# Patient Record
Sex: Female | Born: 1975 | Race: White | Hispanic: No | Smoking: Current every day smoker
Health system: Southern US, Community
[De-identification: ages and names within clinical notes are randomized; demographics above are authoritative.]

## PROBLEM LIST (undated history)

## (undated) DIAGNOSIS — Z974 Presence of external hearing-aid: Secondary | ICD-10-CM

## (undated) DIAGNOSIS — G43909 Migraine, unspecified, not intractable, without status migrainosus: Secondary | ICD-10-CM

## (undated) DIAGNOSIS — G9050 Complex regional pain syndrome I, unspecified: Secondary | ICD-10-CM

## (undated) DIAGNOSIS — R42 Dizziness and giddiness: Secondary | ICD-10-CM

## (undated) DIAGNOSIS — M1990 Unspecified osteoarthritis, unspecified site: Secondary | ICD-10-CM

## (undated) DIAGNOSIS — K219 Gastro-esophageal reflux disease without esophagitis: Secondary | ICD-10-CM

## (undated) DIAGNOSIS — M797 Fibromyalgia: Secondary | ICD-10-CM

## (undated) HISTORY — PX: MOUTH SURGERY: SHX715

## (undated) HISTORY — PX: ABDOMINAL HYSTERECTOMY: SHX81

## (undated) HISTORY — PX: CARPAL TUNNEL RELEASE: SHX101

---

## 2017-05-02 ENCOUNTER — Other Ambulatory Visit: Payer: Self-pay | Admitting: Student

## 2017-05-02 DIAGNOSIS — M75101 Unspecified rotator cuff tear or rupture of right shoulder, not specified as traumatic: Secondary | ICD-10-CM

## 2017-05-10 ENCOUNTER — Ambulatory Visit
Admission: RE | Admit: 2017-05-10 | Discharge: 2017-05-10 | Disposition: A | Discharge status: Home / Self Care | Payer: Medicaid Other | Source: Ambulatory Visit | Attending: Student | Admitting: Student

## 2017-05-10 DIAGNOSIS — M7551 Bursitis of right shoulder: Secondary | ICD-10-CM | POA: Insufficient documentation

## 2017-05-10 DIAGNOSIS — M24211 Disorder of ligament, right shoulder: Secondary | ICD-10-CM | POA: Insufficient documentation

## 2017-05-10 DIAGNOSIS — M25511 Pain in right shoulder: Secondary | ICD-10-CM | POA: Insufficient documentation

## 2017-05-10 DIAGNOSIS — M75101 Unspecified rotator cuff tear or rupture of right shoulder, not specified as traumatic: Secondary | ICD-10-CM

## 2017-05-15 ENCOUNTER — Other Ambulatory Visit: Payer: Self-pay

## 2017-05-15 ENCOUNTER — Encounter: Payer: Self-pay | Admitting: *Deleted

## 2017-05-17 ENCOUNTER — Encounter
Admission: RE | Disposition: A | Discharge status: Home / Self Care | Payer: Self-pay | Source: Ambulatory Visit | Attending: Surgery

## 2017-05-17 ENCOUNTER — Ambulatory Visit: Payer: Medicaid Other | Admitting: Anesthesiology

## 2017-05-17 ENCOUNTER — Ambulatory Visit
Admission: RE | Admit: 2017-05-17 | Discharge: 2017-05-17 | Disposition: A | Discharge status: Home / Self Care | Payer: Medicaid Other | Source: Ambulatory Visit | Attending: Surgery | Admitting: Surgery

## 2017-05-17 DIAGNOSIS — K219 Gastro-esophageal reflux disease without esophagitis: Secondary | ICD-10-CM | POA: Diagnosis not present

## 2017-05-17 DIAGNOSIS — M7501 Adhesive capsulitis of right shoulder: Secondary | ICD-10-CM | POA: Diagnosis not present

## 2017-05-17 DIAGNOSIS — Z79899 Other long term (current) drug therapy: Secondary | ICD-10-CM | POA: Insufficient documentation

## 2017-05-17 HISTORY — DX: Presence of external hearing-aid: Z97.4

## 2017-05-17 HISTORY — DX: Dizziness and giddiness: R42

## 2017-05-17 HISTORY — DX: Unspecified osteoarthritis, unspecified site: M19.90

## 2017-05-17 HISTORY — DX: Gastro-esophageal reflux disease without esophagitis: K21.9

## 2017-05-17 HISTORY — PX: CLOSED MANIPULATION SHOULDER WITH STERIOD INJECTION: SHX5611

## 2017-05-17 HISTORY — DX: Complex regional pain syndrome I, unspecified: G90.50

## 2017-05-17 HISTORY — DX: Fibromyalgia: M79.7

## 2017-05-17 HISTORY — DX: Migraine, unspecified, not intractable, without status migrainosus: G43.909

## 2017-05-17 SURGERY — CLOSED MANIPULATION SHOULDER WITH STEROID INJECTION
Anesthesia: General | Laterality: Right | Wound class: Clean Contaminated

## 2017-05-17 MED ORDER — HYDROCODONE-ACETAMINOPHEN 5-325 MG PO TABS: 1.0000 | ORAL_TABLET | ORAL | Status: DC | PRN

## 2017-05-17 MED ORDER — ONDANSETRON HCL 4 MG PO TABS: 4.0000 mg | ORAL_TABLET | Freq: Four times a day (QID) | ORAL | Status: DC | PRN

## 2017-05-17 MED ORDER — PROPOFOL 10 MG/ML IV BOLUS
INTRAVENOUS | Status: DC | PRN
  Administered 2017-05-17: 20 mg via INTRAVENOUS
  Administered 2017-05-17: 100 mg via INTRAVENOUS

## 2017-05-17 MED ORDER — MEPERIDINE HCL 25 MG/ML IJ SOLN: 6.2500 mg | INTRAMUSCULAR | Status: DC | PRN

## 2017-05-17 MED ORDER — PROMETHAZINE HCL 25 MG/ML IJ SOLN: 6.2500 mg | INTRAMUSCULAR | Status: DC | PRN

## 2017-05-17 MED ORDER — OXYCODONE HCL 5 MG/5ML PO SOLN: 5.0000 mg | Freq: Once | ORAL | Status: DC | PRN

## 2017-05-17 MED ORDER — HYDROCODONE-ACETAMINOPHEN 5-325 MG PO TABS: 1.0000 | ORAL_TABLET | Freq: Four times a day (QID) | ORAL | 0 refills | Status: AC | PRN

## 2017-05-17 MED ORDER — LIDOCAINE HCL 1 % IJ SOLN
INTRAMUSCULAR | Status: DC | PRN
  Administered 2017-05-17: 9 mL

## 2017-05-17 MED ORDER — METOCLOPRAMIDE HCL 5 MG/ML IJ SOLN: 5.0000 mg | Freq: Three times a day (TID) | INTRAMUSCULAR | Status: DC | PRN

## 2017-05-17 MED ORDER — METOCLOPRAMIDE HCL 5 MG PO TABS: 5.0000 mg | ORAL_TABLET | Freq: Three times a day (TID) | ORAL | Status: DC | PRN

## 2017-05-17 MED ORDER — FENTANYL CITRATE (PF) 100 MCG/2ML IJ SOLN
25.0000 ug | INTRAMUSCULAR | Status: DC | PRN
  Administered 2017-05-17: 100 ug via INTRAVENOUS

## 2017-05-17 MED ORDER — POTASSIUM CHLORIDE IN NACL 20-0.9 MEQ/L-% IV SOLN: INTRAVENOUS | Status: DC

## 2017-05-17 MED ORDER — DEXMEDETOMIDINE HCL 200 MCG/2ML IV SOLN
INTRAVENOUS | Status: DC | PRN
  Administered 2017-05-17: 8 ug via INTRAVENOUS

## 2017-05-17 MED ORDER — TRIAMCINOLONE ACETONIDE 40 MG/ML IJ SUSP
INTRAMUSCULAR | Status: DC | PRN
Start: 2017-05-17 — End: 2017-05-17
  Administered 2017-05-17: 40 mg via INTRA_ARTICULAR

## 2017-05-17 MED ORDER — ONDANSETRON 4 MG PO TBDP: 4.0000 mg | ORAL_TABLET | Freq: Three times a day (TID) | ORAL | 1 refills | Status: AC | PRN

## 2017-05-17 MED ORDER — ONDANSETRON HCL 4 MG/2ML IJ SOLN: 4.0000 mg | Freq: Four times a day (QID) | INTRAMUSCULAR | Status: DC | PRN

## 2017-05-17 MED ORDER — OXYCODONE HCL 5 MG PO TABS: 5.0000 mg | ORAL_TABLET | Freq: Once | ORAL | Status: DC | PRN

## 2017-05-17 MED ORDER — LACTATED RINGERS IV SOLN
10.0000 mL/h | INTRAVENOUS | Status: DC
  Administered 2017-05-17: 10 mL/h via INTRAVENOUS

## 2017-05-17 MED ORDER — MIDAZOLAM HCL 2 MG/2ML IJ SOLN
INTRAMUSCULAR | Status: DC | PRN
  Administered 2017-05-17: 2 mg via INTRAVENOUS

## 2017-05-17 SURGICAL SUPPLY — 7 items
BNDG ADH 2 X3.75 FABRIC TAN LF (GAUZE/BANDAGES/DRESSINGS) ×3 IMPLANT
KIT RM TURNOVER STRD PROC AR (KITS) ×3 IMPLANT
NEEDLE HYPO 21X1.5 SAFETY (NEEDLE) IMPLANT
PAD ALCOHOL SWAB (MISCELLANEOUS) IMPLANT
SLING ARM LRG DEEP (SOFTGOODS) ×3 IMPLANT
STRAP BODY AND KNEE 60X3 (MISCELLANEOUS) ×3 IMPLANT
SYRINGE 10CC LL (SYRINGE) IMPLANT

## 2017-05-17 NOTE — Progress Notes (Signed)
Assisted Nicole Scouras, ANMD with right, ultrasound guided, interscalene  block. Side rails up, monitors on throughout procedure. See vital signs in flow sheet. Tolerated Procedure well.  

## 2017-05-17 NOTE — Op Note (Signed)
05/17/2017  1:05 PM  Patient:   Stephanie Massey  Pre-Op Diagnosis:   Primary adhesive capsulitis, right shoulder.  Post-Op Diagnosis:   Same  Procedure:   Manipulation under anesthesia with steroid injection, right shoulder.  Surgeon:   Maryagnes AmosJ. Jeffrey Shanaye Rief, MD  Assistant:   None  Anesthesia:   IV sedation with interscalene block placed preoperatively by anesthesiologist.  Findings:   As above. Prior to manipulation, the right shoulder could be forward flexed to 120 and abducted to 110. At 90 of abduction, the shoulder could be externally rotated to 60 and internally rotated to 35. Following manipulation, the shoulder could be forward flexed to 170, abducted to 165 and, at 90 of abduction, externally rotated to 95 and internally rotated to 60.  Complications:   None  EBL:   0 cc  Fluids:   700 cc crystalloid  TT:   None  Drains:   None  Closure:   None  Brief Clinical Note:   The patient is a 42 year old female with an 508-month history of painful limited motion of her right shoulder. The patient had undergone physical therapy and a steroid injection in the past for a similar condition with improvement with subsequent resolution of the symptoms. However, this time, her symptoms have persisted. The patient's history and examination are consistent with adhesive capsulitis. The patient presents at this time for a manipulation under anesthesia with steroid injection of the right shoulder.  Procedure:   The patient underwent placement of an interscalene block in the preoperative holding area before being brought into the operating room and lain in the supine position. After adequate IV sedation was achieved, a timeout was performed to verify the correct surgical site. The right shoulder was gently manipulated in both abduction and external rotation, as well as adduction and internal rotation. Several palpable and audible pops were heard as the scar tissue released, permitting  full range of motion of the shoulder. The glenohumeral joint was injected sterilely using 1 cc of Kenalog-40 and 9 cc of 0.25% Sensorcaine with epinephrine before the patient was placed into a sling. The patient was then awakened and returned to the recovery room in satisfactory condition after tolerating the procedure well.

## 2017-05-17 NOTE — H&P (Signed)
Paper H&P to be scanned into permanent record. H&P reviewed and patient re-examined. No changes. 

## 2017-05-17 NOTE — Discharge Instructions (Signed)
General Anesthesia, Adult, Care After These instructions provide you with information about caring for yourself after your procedure. Your health care provider may also give you more specific instructions. Your treatment has been planned according to current medical practices, but problems sometimes occur. Call your health care provider if you have any problems or questions after your procedure. What can I expect after the procedure? After the procedure, it is common to have:  Vomiting.  A sore throat.  Mental slowness.  It is common to feel:  Nauseous.  Cold or shivery.  Sleepy.  Tired.  Sore or achy, even in parts of your body where you did not have surgery.  Follow these instructions at home: For at least 24 hours after the procedure:  Do not: ? Participate in activities where you could fall or become injured. ? Drive. ? Use heavy machinery. ? Drink alcohol. ? Take sleeping pills or medicines that cause drowsiness. ? Make important decisions or sign legal documents. ? Take care of children on your own.  Rest. Eating and drinking  If you vomit, drink water, juice, or soup when you can drink without vomiting.  Drink enough fluid to keep your urine clear or pale yellow.  Make sure you have little or no nausea before eating solid foods.  Follow the diet recommended by your health care provider. General instructions  Have a responsible adult stay with you until you are awake and alert.  Return to your normal activities as told by your health care provider. Ask your health care provider what activities are safe for you.  Take over-the-counter and prescription medicines only as told by your health care provider.  If you smoke, do not smoke without supervision.  Keep all follow-up visits as told by your health care provider. This is important. Contact a health care provider if:  You continue to have nausea or vomiting at home, and medicines are not helpful.  You  cannot drink fluids or start eating again.  You cannot urinate after 8-12 hours.  You develop a skin rash.  You have fever.  You have increasing redness at the site of your procedure. Get help right away if:  You have difficulty breathing.  You have chest pain.  You have unexpected bleeding.  You feel that you are having a life-threatening or urgent problem. This information is not intended to replace advice given to you by your health care provider. Make sure you discuss any questions you have with your health care provider. Document Released: 07/11/2000 Document Revised: 09/07/2015 Document Reviewed: 03/19/2015 Elsevier Interactive Patient Education  2018 [employer]   Use sling until arm "wakes up". May shower as of tomorrow. Apply ice to shoulder frequently. Take ibuprofen 600 mg TID with meals for 7-10 days, then as necessary. Take ES Tylenol or pain medication as prescribed when needed.  Return for follow-up in 10-14 days or as scheduled. Start physical therapy tomorrow as scheduled.

## 2017-05-17 NOTE — Anesthesia Postprocedure Evaluation (Signed)
Anesthesia Post Note  Patient: Rance MuirBrandy Michelle Castilleja  Procedure(s) Performed: CLOSED MANIPULATION SHOULDER WITH STEROID INJECTION (Right )  Patient location during evaluation: PACU Anesthesia Type: General Level of consciousness: awake and alert Pain management: pain level controlled Vital Signs Assessment: post-procedure vital signs reviewed and stable Respiratory status: spontaneous breathing, nonlabored ventilation, respiratory function stable and patient connected to nasal cannula oxygen Cardiovascular status: blood pressure returned to baseline and stable Postop Assessment: no apparent nausea or vomiting Anesthetic complications: no    SCOURAS, NICOLE ELAINE

## 2017-05-17 NOTE — Anesthesia Procedure Notes (Signed)
Anesthesia Regional Block: Interscalene brachial plexus block   Pre-Anesthetic Checklist: ,, timeout performed, Correct Patient, Correct Site, Correct Laterality, Correct Procedure, Correct Position, site marked, Risks and benefits discussed,  Surgical consent,  Pre-op evaluation,  At surgeon's request and post-op pain management  Laterality: Right  Prep: chloraprep       Needles:  Injection technique: Single-shot  Needle Type: Stimiplex     Needle Length: 2cm  Needle Gauge: 22     Additional Needles:   Procedures:,,,, ultrasound used (permanent image in chart),,,,  Narrative:  Start time: 05/17/2017 11:40 AM End time: 05/17/2017 11:44 AM  Performed by: Personally  Anesthesiologist: Aldine ContesScouras, Nicole Elaine, MD

## 2017-05-17 NOTE — Anesthesia Procedure Notes (Signed)
Procedure Name: MAC Date/Time: 05/17/2017 12:45 PM Performed by: Janna Arch, CRNA Pre-anesthesia Checklist: Patient identified, Emergency Drugs available, Suction available and Patient being monitored Patient Re-evaluated:Patient Re-evaluated prior to induction Oxygen Delivery Method: Nasal cannula

## 2017-05-17 NOTE — Anesthesia Preprocedure Evaluation (Signed)
Anesthesia Evaluation  Patient identified by MRN, date of birth, ID band Patient awake    Reviewed: Allergy & Precautions, H&P , NPO status , Patient's Chart, lab work & pertinent test results, reviewed documented beta blocker date and time   Airway Mallampati: II  TM Distance: >3 FB Neck ROM: full    Dental no notable dental hx.    Pulmonary neg pulmonary ROS, Current Smoker,    Pulmonary exam normal breath sounds clear to auscultation       Cardiovascular Exercise Tolerance: Good negative cardio ROS   Rhythm:regular Rate:Normal     Neuro/Psych  Headaches, RSD Fibromyalgia negative psych ROS   GI/Hepatic Neg liver ROS, GERD  ,  Endo/Other  negative endocrine ROS  Renal/GU negative Renal ROS  negative genitourinary   Musculoskeletal   Abdominal   Peds  Hematology negative hematology ROS (+)   Anesthesia Other Findings   Reproductive/Obstetrics negative OB ROS                             Anesthesia Physical Anesthesia Plan  ASA: II  Anesthesia Plan: General   Post-op Pain Management:  Regional for Post-op pain   Induction:   PONV Risk Score and Plan:   Airway Management Planned:   Additional Equipment:   Intra-op Plan:   Post-operative Plan:   Informed Consent: I have reviewed the patients History and Physical, chart, labs and discussed the procedure including the risks, benefits and alternatives for the proposed anesthesia with the patient or authorized representative who has indicated his/her understanding and acceptance.   Dental Advisory Given  Plan Discussed with: CRNA  Anesthesia Plan Comments:         Anesthesia Quick Evaluation

## 2017-05-17 NOTE — Transfer of Care (Signed)
Immediate Anesthesia Transfer of Care Note  Patient: Stephanie MuirBrandy Michelle Massey  Procedure(s) Performed: CLOSED MANIPULATION SHOULDER WITH STEROID INJECTION (Right )  Patient Location: PACU  Anesthesia Type: General  Level of Consciousness: awake, alert  and patient cooperative  Airway and Oxygen Therapy: Patient Spontanous Breathing and Patient connected to supplemental oxygen  Post-op Assessment: Post-op Vital signs reviewed, Patient's Cardiovascular Status Stable, Respiratory Function Stable, Patent Airway and No signs of Nausea or vomiting  Post-op Vital Signs: Reviewed and stable  Complications: No apparent anesthesia complications

## 2017-05-18 ENCOUNTER — Encounter: Payer: Self-pay | Admitting: Surgery

## 2017-05-23 ENCOUNTER — Encounter: Payer: Self-pay | Admitting: Student in an Organized Health Care Education/Training Program

## 2017-05-23 ENCOUNTER — Other Ambulatory Visit: Payer: Self-pay

## 2017-05-23 ENCOUNTER — Other Ambulatory Visit: Payer: Self-pay | Admitting: Student in an Organized Health Care Education/Training Program

## 2017-05-23 ENCOUNTER — Ambulatory Visit
Payer: Medicaid Other | Attending: Student in an Organized Health Care Education/Training Program | Admitting: Student in an Organized Health Care Education/Training Program

## 2017-05-23 VITALS — BP 113/62 | HR 78 | Temp 98.1°F | Resp 18 | Ht 64.0 in | Wt 103.0 lb

## 2017-05-23 DIAGNOSIS — Z885 Allergy status to narcotic agent status: Secondary | ICD-10-CM | POA: Diagnosis not present

## 2017-05-23 DIAGNOSIS — G894 Chronic pain syndrome: Secondary | ICD-10-CM | POA: Diagnosis not present

## 2017-05-23 DIAGNOSIS — M792 Neuralgia and neuritis, unspecified: Secondary | ICD-10-CM | POA: Insufficient documentation

## 2017-05-23 DIAGNOSIS — M25511 Pain in right shoulder: Secondary | ICD-10-CM | POA: Diagnosis not present

## 2017-05-23 DIAGNOSIS — M7501 Adhesive capsulitis of right shoulder: Secondary | ICD-10-CM | POA: Diagnosis not present

## 2017-05-23 DIAGNOSIS — K219 Gastro-esophageal reflux disease without esophagitis: Secondary | ICD-10-CM | POA: Insufficient documentation

## 2017-05-23 DIAGNOSIS — G8929 Other chronic pain: Secondary | ICD-10-CM | POA: Insufficient documentation

## 2017-05-23 DIAGNOSIS — G9050 Complex regional pain syndrome I, unspecified: Secondary | ICD-10-CM | POA: Insufficient documentation

## 2017-05-23 DIAGNOSIS — F17210 Nicotine dependence, cigarettes, uncomplicated: Secondary | ICD-10-CM | POA: Insufficient documentation

## 2017-05-23 DIAGNOSIS — Z79899 Other long term (current) drug therapy: Secondary | ICD-10-CM | POA: Diagnosis not present

## 2017-05-23 MED ORDER — AMITRIPTYLINE HCL 25 MG PO TABS: ORAL_TABLET | ORAL | 0 refills | Status: AC

## 2017-05-23 NOTE — Patient Instructions (Addendum)
1. We will need release of records from your [place] pain [facility] regarding all the previous procedures that you've done. I need this before we can discuss any further procedures.  2. Start Amitriptyline 25 mg at bedtime for 2 weeks and then increase to 50 mg at bedtime

## 2017-05-23 NOTE — Progress Notes (Signed)
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 right stellate ganglion block for her right upper extremity symptoms.  On my exam, patient has  paresthesias in her right upper extremity where she feels constant numbness and tingling.  She states that the symptoms had improved after her nerve block in 2007.  Patient wants to avoid medications specifically opioid medications since they are not effective.  In fact she is not utilizing much of her hydrocodone after her shoulder surgery.  I stressed the importance of being on a neuropathic agent.  She has been on gabapentin  for many years and is currently being weaned down from 600 mg 3 times daily to 300 mg 3 times daily.  She is also having difficulty sleeping and is awaking in the middle the night due to pain.  We will have the patient start amitriptyline 25 mg nightly for 2 weeks and then have her increase to 50 mill grams nightly thereafter to assist with her neuropathic pain symptoms as well as sleep.  After we have received records from her pain clinic in [place], we can  discuss future therapeutic plan which could include right stellate ganglion nerve block, right interscalene nerve block, right supraclavicular nerve block.  Plan: -UDS today (expect this to be positive for hydrocodone) -Patient to sign release of records so that we can obtain records from [facility] pain clinic detailing what sort of interventions were taken (i.e. stellate ganglion nerve block, interscalene nerve block, cervical facet block) -Amitriptyline prescription as below. -Follow-up in 3-4 weeks or earlier after receiving outside medical records   Ordered Lab-work, Procedure(s), Referral(s), & Consult(s): Orders Placed This Encounter  Procedures  . Compliance Drug Analysis, Ur   Pharmacotherapy (current): Medications ordered:  Meds ordered this encounter  Medications  . amitriptyline (ELAVIL) 25 MG tablet    Sig: 25 mg qhs x 2 weeks then 50 mg qhs    Dispense:  60 tablet    Refill:  0   Medications administered during this visit: Najmo M. Bockrath had no medications administered during this visit.   Pharmacological management options:  Opioid Analgesics: The patient was informed that there is no guarantee that she would be a candidate for opioid analgesics. The decision will be made following CDC guidelines. This decision will be based on the results of diagnostic studies, as well as Stephanie Massey's risk profile.   Membrane stabilizer: To be determined at a later time consider Lyrica, venlafaxine, Cymbalta  Muscle relaxant: To be determined at a later time consider tizanidine, baclofen  NSAID: To be determined at a later time meloxicam, Voltaren  Other analgesic(s): To be determined at a later time compounded ketamine cream   Interventional management options: Stephanie Massey was informed that there is no guarantee that she would be a candidate for interventional therapies. The decision will be based on the results of diagnostic studies, as well as Stephanie Massey's risk profile.   Procedure(s) under consideration:  -Right stellate ganglion block -Right interscalene/supraclavicular nerve block -Cervical facet medial branch nerve block  -cervical trigger point injections   Provider-requested follow-up: Return in about 3 weeks (around 06/13/2017).  Future Appointments  Date Time Provider [place]  06/13/2017  8:45 AM Gillis Santa, MD [facility] None    Primary Care Physician: Baxter Hire, MD Location: [facility] Note by: Gillis Santa, M.D, Date: 05/23/2017; Time: 11:42 AM  Patient Instructions  1. We will need release of records from your [place] pain clinic regarding all the previous procedures that you've done. I need this before we can discuss any further procedures.  2. Start Amitriptyline 25 mg at bedtime for 2 weeks and then increase to 50 mg at bedtime

## 2017-05-23 NOTE — Progress Notes (Signed)
Safety precautions to be maintained throughout the outpatient stay will include: orient to surroundings, keep bed in low position, maintain call bell within reach at all times, provide assistance with transfer out of bed and ambulation.  

## 2017-05-27 LAB — COMPLIANCE DRUG ANALYSIS, UR

## 2017-06-13 ENCOUNTER — Other Ambulatory Visit: Payer: Self-pay

## 2017-06-13 ENCOUNTER — Ambulatory Visit
Payer: Medicaid Other | Attending: Student in an Organized Health Care Education/Training Program | Admitting: Student in an Organized Health Care Education/Training Program

## 2017-06-13 ENCOUNTER — Encounter: Payer: Self-pay | Admitting: Student in an Organized Health Care Education/Training Program

## 2017-06-13 VITALS — BP 127/76 | HR 86 | Temp 98.5°F | Resp 16 | Ht 64.0 in | Wt 100.0 lb

## 2017-06-13 DIAGNOSIS — G894 Chronic pain syndrome: Secondary | ICD-10-CM | POA: Insufficient documentation

## 2017-06-13 DIAGNOSIS — Z885 Allergy status to narcotic agent status: Secondary | ICD-10-CM | POA: Diagnosis not present

## 2017-06-13 DIAGNOSIS — M797 Fibromyalgia: Secondary | ICD-10-CM | POA: Insufficient documentation

## 2017-06-13 DIAGNOSIS — G9050 Complex regional pain syndrome I, unspecified: Secondary | ICD-10-CM | POA: Insufficient documentation

## 2017-06-13 DIAGNOSIS — G629 Polyneuropathy, unspecified: Secondary | ICD-10-CM | POA: Diagnosis not present

## 2017-06-13 DIAGNOSIS — M898X8 Other specified disorders of bone, other site: Secondary | ICD-10-CM | POA: Diagnosis present

## 2017-06-13 DIAGNOSIS — F17210 Nicotine dependence, cigarettes, uncomplicated: Secondary | ICD-10-CM | POA: Insufficient documentation

## 2017-06-13 DIAGNOSIS — M7501 Adhesive capsulitis of right shoulder: Secondary | ICD-10-CM | POA: Diagnosis not present

## 2017-06-13 DIAGNOSIS — R0789 Other chest pain: Secondary | ICD-10-CM | POA: Insufficient documentation

## 2017-06-13 DIAGNOSIS — K219 Gastro-esophageal reflux disease without esophagitis: Secondary | ICD-10-CM | POA: Diagnosis not present

## 2017-06-13 DIAGNOSIS — M25511 Pain in right shoulder: Secondary | ICD-10-CM | POA: Diagnosis not present

## 2017-06-13 DIAGNOSIS — G43909 Migraine, unspecified, not intractable, without status migrainosus: Secondary | ICD-10-CM | POA: Diagnosis not present

## 2017-06-13 DIAGNOSIS — G8929 Other chronic pain: Secondary | ICD-10-CM

## 2017-06-13 DIAGNOSIS — M542 Cervicalgia: Secondary | ICD-10-CM | POA: Diagnosis not present

## 2017-06-13 DIAGNOSIS — Z79899 Other long term (current) drug therapy: Secondary | ICD-10-CM | POA: Insufficient documentation

## 2017-06-13 NOTE — Progress Notes (Signed)
Patient's Name: Stephanie Massey  MRN: [id number]  Referring Provider: Gracelyn Nurse, MD  DOB: Oct 05, 1975  PCP: Gracelyn Nurse, MD  DOS: 06/13/2017  Note by: Edward Jolly, MD  Service setting: Ambulatory outpatient  Specialty: Interventional Pain Management  Location: [facility] (AMB) Pain Management Facility    Patient type: Established   Primary Reason(s) for Visit: Evaluation of chronic illnesses with exacerbation, or progression (Level of risk: moderate) CC: Pain (right collarbone area)  HPI  Stephanie Massey is a 42 y.o. year old, female patient, who comes today for a follow-up evaluation. She has RSD (reflex sympathetic dystrophy); Chronic right shoulder pain; Adhesive capsulitis of right shoulder; and Neuropathic pain on their problem list. Stephanie Massey was last seen on 05/23/2017. Her primarily concern today is the Pain (right collarbone area)  Pain Assessment: Location: Right, Upper Chest Radiating: denies Onset: More than a month ago Duration:   Quality: Burning Severity: 4 /10 (self-reported pain score)  Effect on ADL: although pt states ROM with right arm is better, she is still unable to lift right arm higher than shoulder Timing: Constant Modifying factors: procedures, gabapentin is helping "some"  Further details on both, my assessment(s), as well as the proposed treatment plan, please see below.  Patient notes improvement of right shoulder pain.  She states that the nerve pain that she was initially seen for on February 5 has significantly improved.  She is now endorsing focal anterior chest wall pain at the junction of her costochondral cartilage around her third or fourth rib.  This area is palpable and tender to palpation.  She states that this is very different than the shoulder pain she was experiencing prior.  Patient has been participating and physical therapy.  She has improved range of motion of her right shoulder.  She is able to abductor shoulder greater than 90  degrees.  She states that given her improvement of her right shoulder pain she has not had to utilize the amitriptyline.   Note: Lab results reviewed.  Recent Diagnostic Imaging Review   Results for orders placed during the hospital encounter of 05/10/17  MR SHOULDER RIGHT WO CONTRAST   Narrative CLINICAL DATA:  Shoulder pain while starting a lawnmower.  EXAM: MRI OF THE RIGHT SHOULDER WITHOUT CONTRAST  TECHNIQUE: Multiplanar, multisequence MR imaging of the shoulder was performed. No intravenous contrast was administered.  COMPARISON:  None.  FINDINGS: Rotator cuff: Intact supraspinatus tendon. Infraspinatus tendon is intact. Teres minor tendon is intact. Subscapularis tendon is intact.  Muscles: No atrophy or fatty replacement of nor abnormal signal within, the muscles of the rotator cuff.  Biceps long head:  Intact.  Acromioclavicular Joint: Normal acromioclavicular joint. Type I acromion. Small amount of subacromial/subdeltoid bursal fluid.  Glenohumeral Joint: No joint effusion. No chondral defect. Thickening of humeral insertion of the anterior band of the inferior glenohumeral ligament with mild pericapsular edema which may reflect injury versus adhesive capsulitis.  Labrum:  Intact.  Bones: No focal marrow signal abnormality. No fracture or dislocation.  Other: No fluid collection or hematoma.  IMPRESSION: 1. No rotator cuff tear. 2. Thickening of humeral insertion of the anterior band of the inferior glenohumeral ligament with mild pericapsular edema which may reflect injury versus adhesive capsulitis. 3. Mild subacromial/subdeltoid bursitis.   Electronically Signed   By: Elige Ko   On: 05/10/2017 10:47     Complexity Note: Imaging results reviewed. Results shared with Stephanie Massey, using Layman's terms.  Meds   Current Outpatient Medications:  .  Butalbital-APAP-Caffeine (FIORICET) 50-300-40 MG CAPS, Take by mouth  every 4 (four) hours as needed., Disp: , Rfl:  .  gabapentin (NEURONTIN) 600 MG tablet, Take 600 mg by mouth 3 (three) times daily., Disp: , Rfl:  .  ibuprofen (ADVIL,MOTRIN) 200 MG tablet, Take 200 mg by mouth every 6 (six) hours as needed., Disp: , Rfl:  .  lactulose (CHRONULAC) 10 GM/15ML solution, Take 20 g by mouth 2 (two) times daily., Disp: , Rfl:  .  meclizine (ANTIVERT) 12.5 MG tablet, Take 12.5 mg by mouth 3 (three) times daily as needed for dizziness., Disp: , Rfl:  .  omeprazole (PRILOSEC) 40 MG capsule, Take 40 mg by mouth 2 (two) times daily., Disp: , Rfl:  .  ondansetron (ZOFRAN ODT) 4 MG disintegrating tablet, Take 1 tablet (4 mg total) by mouth every 8 (eight) hours as needed for nausea or vomiting., Disp: 20 tablet, Rfl: 1 .  polyethylene glycol (MIRALAX / GLYCOLAX) packet, Take 17 g by mouth daily., Disp: , Rfl:  .  SUMAtriptan (IMITREX) 100 MG tablet, Take 100 mg by mouth every 2 (two) hours as needed for migraine. May repeat in 2 hours if headache persists or recurs., Disp: , Rfl:  .  amitriptyline (ELAVIL) 25 MG tablet, 25 mg qhs x 2 weeks then 50 mg qhs (Patient not taking: Reported on 06/13/2017), Disp: 60 tablet, Rfl: 0 .  HYDROcodone-acetaminophen (NORCO/VICODIN) 5-325 MG tablet, Take 1-2 tablets by mouth every 6 (six) hours as needed for moderate pain. (Patient not taking: Reported on 06/13/2017), Disp: 20 tablet, Rfl: 0  ROS  Constitutional: Denies any fever or chills Gastrointestinal: No reported hemesis, hematochezia, vomiting, or acute GI distress Musculoskeletal: Denies any acute onset joint swelling, redness, loss of ROM, or weakness Neurological: No reported episodes of acute onset apraxia, aphasia, dysarthria, agnosia, amnesia, paralysis, loss of coordination, or loss of consciousness  Allergies  Stephanie Massey is allergic to codeine and dilaudid [hydromorphone hcl].  PFSH  Drug: Stephanie Massey  has no drug history on file. Alcohol:  reports that she does not drink  alcohol. Tobacco:  reports that she has been smoking cigarettes.  She has a 34.00 pack-year smoking history. she has never used smokeless tobacco. Medical:  has a past medical history of Arthritis, Fibromyalgia, GERD (gastroesophageal reflux disease), Hearing aid worn, Migraine headache, RSD (reflex sympathetic dystrophy), and Vertigo. Surgical: Stephanie Massey  has a past surgical history that includes Carpal tunnel release (Left); Mouth surgery; Abdominal hysterectomy; and Closed manipulation shoulder with steroid injection (Right, 05/17/2017). Family: family history is not on file.  Constitutional Exam  General appearance: Well nourished, well developed, and well hydrated. In no apparent acute distress Vitals:   06/13/17 0859  BP: 127/76  Pulse: 86  Resp: 16  Temp: 98.5 F (36.9 C)  TempSrc: Oral  SpO2: 99%  Weight: 100 lb (45.4 kg)  Height: 5\' 4"  (1.626 m)   BMI Assessment: Estimated body mass index is 17.16 kg/m as calculated from the following:   Height as of this encounter: 5\' 4"  (1.626 m).   Weight as of this encounter: 100 lb (45.4 kg).  BMI interpretation table: BMI level Category Range association with higher incidence of chronic pain  <18 kg/m2 Underweight   18.5-24.9 kg/m2 Ideal body weight   25-29.9 kg/m2 Overweight Increased incidence by 20%  30-34.9 kg/m2 Obese (Class I) Increased incidence by 68%  35-39.9 kg/m2 Severe obesity (Class II) Increased incidence by 136%  >  40 kg/m2 Extreme obesity (Class III) Increased incidence by 254%   BMI Readings from Last 4 Encounters:  06/13/17 17.16 kg/m  05/23/17 17.68 kg/m  05/17/17 17.51 kg/m   Wt Readings from Last 4 Encounters:  06/13/17 100 lb (45.4 kg)  05/23/17 103 lb (46.7 kg)  05/17/17 102 lb (46.3 kg)  Psych/Mental status: Alert, oriented x 3 (person, place, & time)       Eyes: PERLA Respiratory: No evidence of acute respiratory distress  Cervical Spine Area Exam  Skin & Axial Inspection: No masses, redness,  edema, swelling, or associated skin lesions Alignment: Symmetrical Functional ROM: Unrestricted ROM      Stability: No instability detected Muscle Tone/Strength: Functionally intact. No obvious neuro-muscular anomalies detected. Sensory (Neurological): Unimpaired Palpation: No palpable anomalies                      Upper Extremity (UE) Exam    Side: Right upper extremity  Side: Left upper extremity   Skin & Extremity Inspection: Right arm cooler to touch than left arm no allodynia appreciated Some redness observed ,   Skin & Extremity Inspection: Skin color, temperature, and hair growth are WNL. No peripheral edema or cyanosis. No masses, redness, swelling, asymmetry, or associated skin lesions. No contractures.   Functional ROM: Decreased ROM for shoulder  Functional ROM: Unrestricted ROM           Muscle Tone/Strength: Functionally intact. No obvious neuro-muscular anomalies detected.  Muscle Tone/Strength: Functionally intact. No obvious neuro-muscular anomalies detected.   Sensory (Neurological): Paresthesia (Tingling sensation)          Sensory (Neurological): Unimpaired           Palpation: Complains of area being tender to palpation              Palpation: No palpable anomalies               Specialized Test(s): Deferred         Specialized Test(s): Deferred          5 out of 5 strength bilateral upper extremity: Shoulder abduction, elbow flexion, elbow extension, thumb extension. able to abduct shoulder slightly greater than 90 degrees  Focal palpable mass along anterior chest wall overlying costochondral junction.  Lumbar Spine Area Exam  Skin & Axial Inspection: No masses, redness, or swelling Alignment: Symmetrical Functional ROM: Unrestricted ROM      Stability: No instability detected Muscle Tone/Strength: Functionally intact. No obvious neuro-muscular anomalies detected. Sensory (Neurological): Unimpaired Palpation: No palpable anomalies       Provocative  Tests: Lumbar Hyperextension and rotation test: evaluation deferred today       Lumbar Lateral bending test: evaluation deferred today       Patrick's Maneuver: evaluation deferred today                    Gait & Posture Assessment  Ambulation: Unassisted Gait: Relatively normal for age and body habitus Posture: WNL   Lower Extremity Exam    Side: Right lower extremity  Side: Left lower extremity  Skin & Extremity Inspection: Skin color, temperature, and hair growth are WNL. No peripheral edema or cyanosis. No masses, redness, swelling, asymmetry, or associated skin lesions. No contractures.  Skin & Extremity Inspection: Skin color, temperature, and hair growth are WNL. No peripheral edema or cyanosis. No masses, redness, swelling, asymmetry, or associated skin lesions. No contractures.  Functional ROM: Unrestricted ROM  Functional ROM: Unrestricted ROM          Muscle Tone/Strength: Functionally intact. No obvious neuro-muscular anomalies detected.  Muscle Tone/Strength: Functionally intact. No obvious neuro-muscular anomalies detected.  Sensory (Neurological): Unimpaired  Sensory (Neurological): Unimpaired  Palpation: No palpable anomalies  Palpation: No palpable anomalies   Assessment  Primary Diagnosis & Pertinent Problem List: The primary encounter diagnosis was Chronic right shoulder pain. Diagnoses of Adhesive capsulitis of right shoulder and Chronic pain syndrome were also pertinent to this visit.  Status Diagnosis  Controlled Controlled Controlled 1. Chronic right shoulder pain   2. Adhesive capsulitis of right shoulder   3. Chronic pain syndrome      42 year old female with history of chronic right shoulder pain status post right shoulder surgery on January 30 for right adhesive Capsulitis who presents for follow-up.  Patient notes significant improvement in her right shoulder pain and right neck pain since her last evaluation with me on 05/23/2017.  Patient has improved  range of motion and states that she is doing better.  She has not utilized amitriptyline since her pain is improved.  She is now complaining of a very focal anterior chest wall pain at the junction of her costochondral margin likely along ribs 3 or 4.  She has not endorsing any neuropathic symptoms nor has any signs or symptoms of RSD or brachial plexopathy at this time.  I do not have any nerve blocks to offer this patient for her focal costochondral pain.  I did recommend topical NSAIDs such as Voltaren gel and/or oral NSAID therapy but the patient was not interested.  Recommended the patient follow-up with her orthopedist for evaluation of this mass along her costochondral margin.  Patient can follow-up as needed.  Time Note: Greater than 50% of the 15 minute(s) of face-to-face time spent with Stephanie Massey, was spent in counseling/coordination of care regarding: Stephanie Massey primary cause of pain and the treatment plan.  Provider-requested follow-up: Return if symptoms worsen or fail to improve.  No future appointments.  Primary Care Physician: Gracelyn NurseJohnston, John D, MD [facility] Note by: Edward JollyBilal Demian Maisel, M.D Date: 06/13/2017; Time: 9:40 AM  There are no Patient Instructions on file for this visit.

## 2017-06-13 NOTE — Progress Notes (Signed)
Safety precautions to be maintained throughout the outpatient stay will include: orient to surroundings, keep bed in low position, maintain call bell within reach at all times, provide assistance with transfer out of bed and ambulation.  

## 2017-06-26 ENCOUNTER — Other Ambulatory Visit: Payer: Self-pay | Admitting: Surgery

## 2017-06-26 DIAGNOSIS — M940 Chondrocostal junction syndrome [Tietze]: Secondary | ICD-10-CM

## 2017-07-06 ENCOUNTER — Ambulatory Visit: Payer: Medicaid Other

## 2019-10-09 IMAGING — MR MR SHOULDER*R* W/O CM
6 series · 40 of 40 positions shown · non-contrast
Comparison: None.

CLINICAL DATA: Shoulder pain while starting a lawnmower.

EXAM:
MRI OF THE RIGHT SHOULDER WITHOUT CONTRAST
TECHNIQUE: Multiplanar, multisequence MR imaging of the shoulder was performed.
No intravenous contrast was administered.

[Series 3: T2 fat-sat · axial · 4.0mm · 0.47mm/px · z∈[-29,+76]mm · 6 of 25 slices shown (1 of 3)]
[im 1/25]
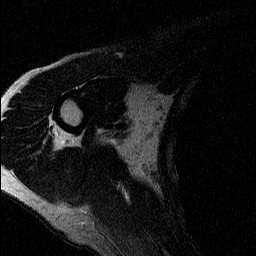
[im 5/25]
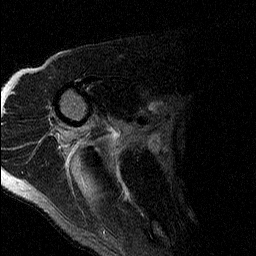
[im 10/25]
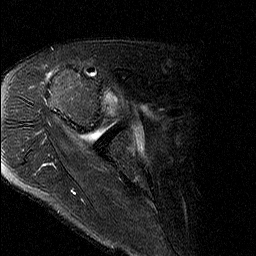
[im 15/25]
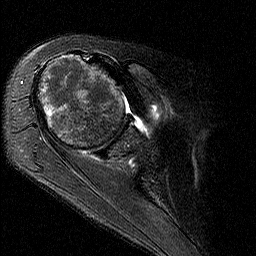
[im 20/25]
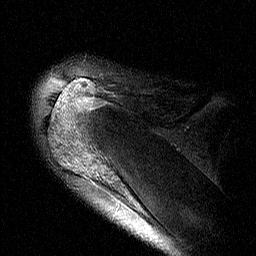
[im 25/25]
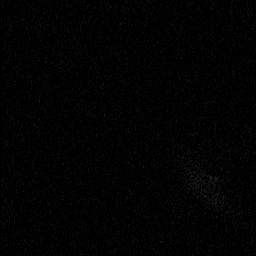

[Series 4: T2 fat-sat · oblique · 4.0mm · 0.62mm/px · 6 of 23 slices shown (2 of 3)]
[im 1/23]
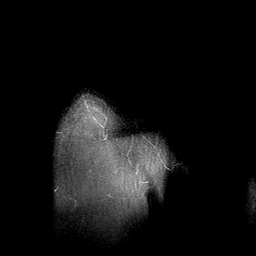
[im 5/23]
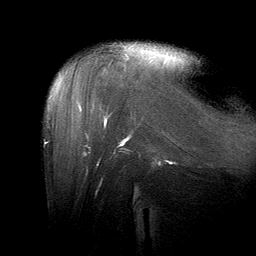
[im 9/23]
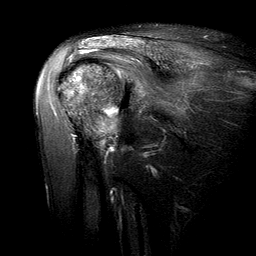
[im 14/23]
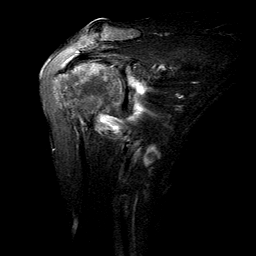
[im 18/23]
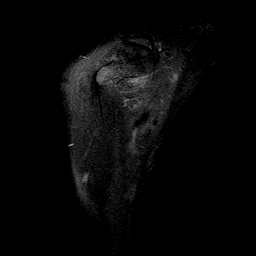
[im 23/23]
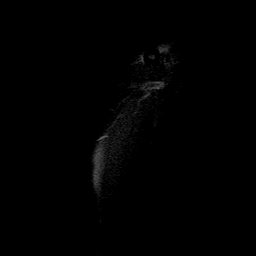

[Series 5: PD · oblique · 4.0mm · 0.62mm/px · 7 of 23 slices shown]
[im 1/23]
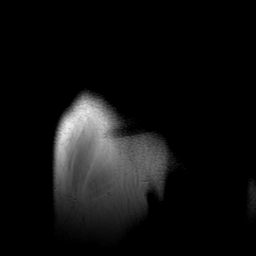
[im 4/23]
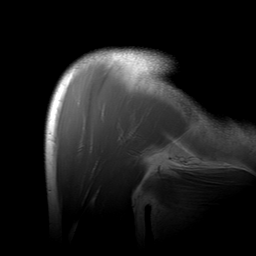
[im 8/23]
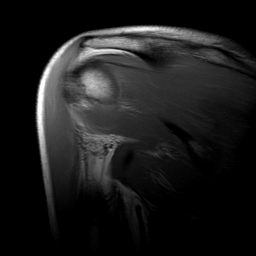
[im 12/23]
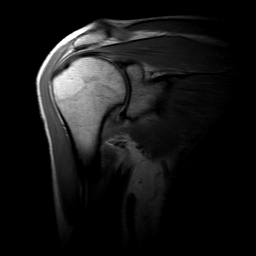
[im 15/23]
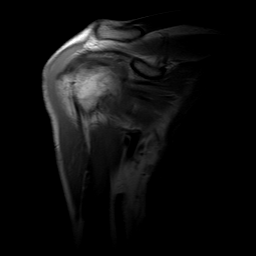
[im 19/23]
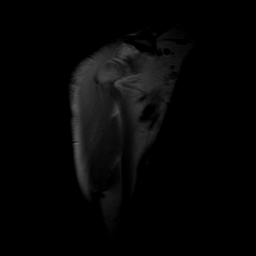
[im 23/23]
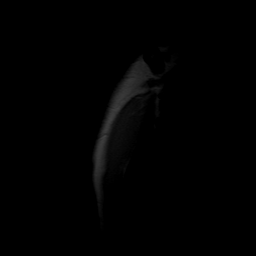

[Series 6: T1 · oblique · 4.0mm · 0.62mm/px · 7 of 23 slices shown]
[im 1/23]
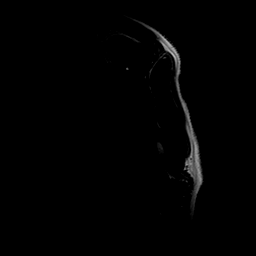
[im 4/23]
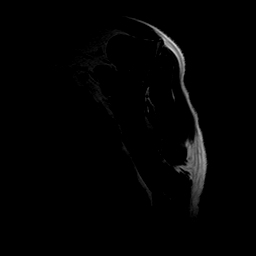
[im 8/23]
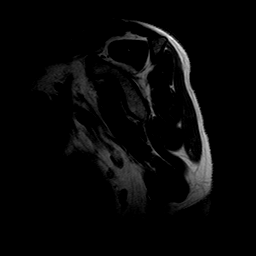
[im 12/23]
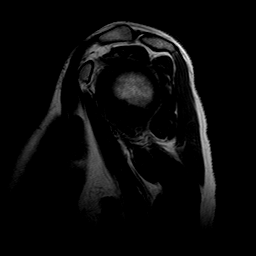
[im 15/23]
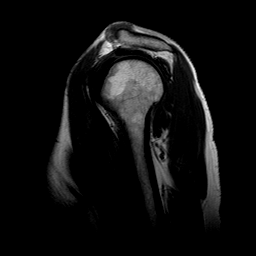
[im 19/23]
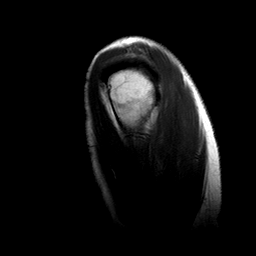
[im 23/23]
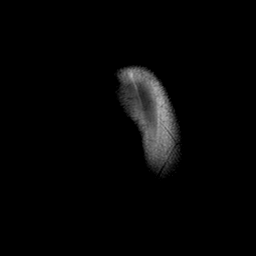

[Series 7: T2 fat-sat · oblique · 4.0mm · 0.62mm/px · 7 of 23 slices shown (3 of 3)]
[im 1/23]
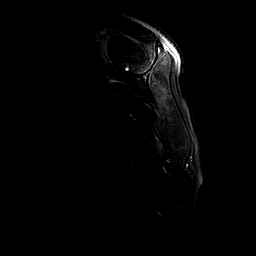
[im 4/23]
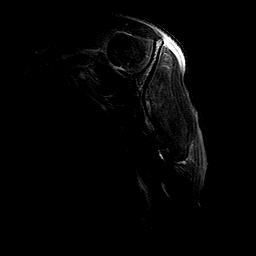
[im 8/23]
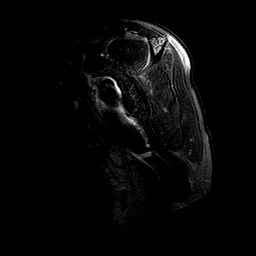
[im 12/23]
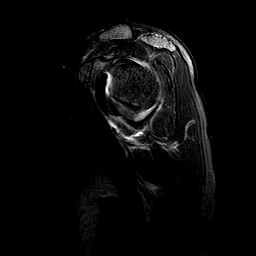
[im 15/23]
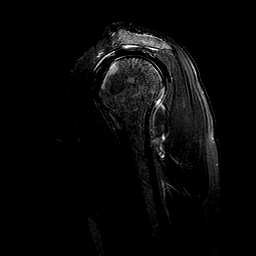
[im 19/23]
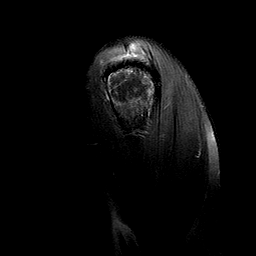
[im 23/23]
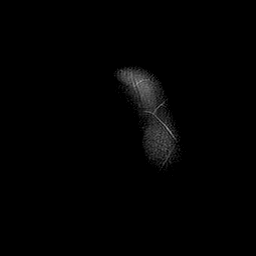

[Series 8: STIR · oblique · 4.0mm · 0.62mm/px · 7 of 23 slices shown]
[im 1/23]
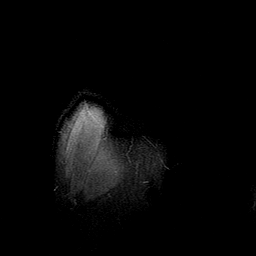
[im 4/23]
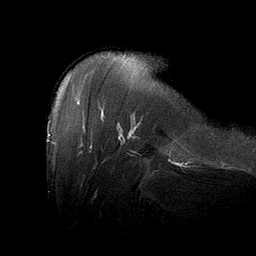
[im 8/23]
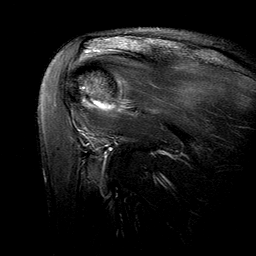
[im 12/23]
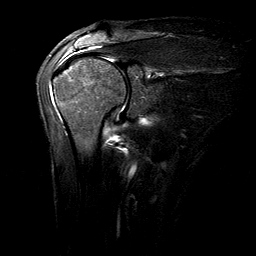
[im 15/23]
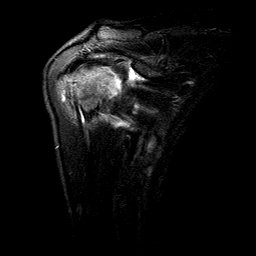
[im 19/23]
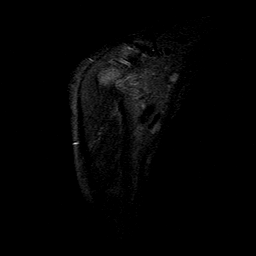
[im 23/23]
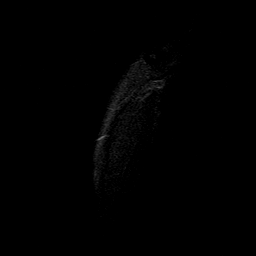

[40 of 40 positions shown; findings below may reference images not displayed]

FINDINGS: Rotator cuff: Intact supraspinatus tendon. Infraspinatus tendon is
intact. Teres minor tendon is intact. Subscapularis tendon is
intact.

Muscles: No atrophy or fatty replacement of nor abnormal signal
within, the muscles of the rotator cuff.

Biceps long head:  Intact.

Acromioclavicular Joint: Normal acromioclavicular joint. Type I
acromion. Small amount of subacromial/subdeltoid bursal fluid.

Glenohumeral Joint: No joint effusion. No chondral defect.
Thickening of humeral insertion of the anterior band of the inferior
glenohumeral ligament with mild pericapsular edema which may reflect
injury versus adhesive capsulitis.

Labrum:  Intact.

Bones: No focal marrow signal abnormality. No fracture or
dislocation.

Other: No fluid collection or hematoma.
IMPRESSION: 1. No rotator cuff tear.
2. Thickening of humeral insertion of the anterior band of the
inferior glenohumeral ligament with mild pericapsular edema which
may reflect injury versus adhesive capsulitis.
3. Mild subacromial/subdeltoid bursitis.
